# Patient Record
Sex: Male | Born: 1995 | Hispanic: Yes | Marital: Single | Smoking: Never smoker
Health system: Southern US, Community
[De-identification: ages and names within clinical notes are randomized; demographics above are authoritative.]

---

## 2015-02-11 ENCOUNTER — Emergency Department (HOSPITAL_COMMUNITY)
Admission: EM | Admit: 2015-02-11 | Discharge: 2015-02-11 | Disposition: A | Discharge status: Home / Self Care | Payer: Medicaid Other | Attending: Emergency Medicine | Admitting: Emergency Medicine

## 2015-02-11 ENCOUNTER — Encounter (HOSPITAL_COMMUNITY): Payer: Self-pay

## 2015-02-11 ENCOUNTER — Emergency Department (HOSPITAL_COMMUNITY): Payer: Medicaid Other

## 2015-02-11 DIAGNOSIS — M791 Myalgia: Secondary | ICD-10-CM | POA: Diagnosis not present

## 2015-02-11 DIAGNOSIS — R05 Cough: Secondary | ICD-10-CM | POA: Diagnosis not present

## 2015-02-11 DIAGNOSIS — R6889 Other general symptoms and signs: Secondary | ICD-10-CM

## 2015-02-11 DIAGNOSIS — R51 Headache: Secondary | ICD-10-CM | POA: Insufficient documentation

## 2015-02-11 DIAGNOSIS — R509 Fever, unspecified: Secondary | ICD-10-CM | POA: Insufficient documentation

## 2015-02-11 LAB — CBC
HCT: 40.6 % (ref 39.0–52.0)
Hemoglobin: 13.8 g/dL (ref 13.0–17.0)
MCH: 25.2 pg — AB (ref 26.0–34.0)
MCHC: 34 g/dL (ref 30.0–36.0)
MCV: 74.2 fL — AB (ref 78.0–100.0)
PLATELETS: 155 10*3/uL (ref 150–400)
RBC: 5.47 MIL/uL (ref 4.22–5.81)
RDW: 13.1 % (ref 11.5–15.5)
WBC: 5.4 10*3/uL (ref 4.0–10.5)

## 2015-02-11 LAB — BASIC METABOLIC PANEL
Anion gap: 9 (ref 5–15)
BUN: 10 mg/dL (ref 6–23)
CO2: 23 mmol/L (ref 19–32)
Calcium: 9.3 mg/dL (ref 8.4–10.5)
Chloride: 102 mmol/L (ref 96–112)
Creatinine, Ser: 0.91 mg/dL (ref 0.50–1.35)
GFR calc Af Amer: >90 mL/min (ref >90)
GFR calc non Af Amer: >90 mL/min (ref >90)
Glucose, Bld: 109 mg/dL — ABNORMAL HIGH (ref 70–99)
POTASSIUM: 3.8 mmol/L (ref 3.5–5.1)
SODIUM: 134 mmol/L — AB (ref 135–145)

## 2015-02-11 MED ORDER — ACETAMINOPHEN 325 MG PO TABS
650.0000 mg | ORAL_TABLET | Freq: Once | ORAL | Status: AC
  Administered 2015-02-11: 650 mg via ORAL
  Filled 2015-02-11: qty 2

## 2015-02-11 MED ORDER — SODIUM CHLORIDE 0.9 % IV BOLUS (SEPSIS)
1000.0000 mL | Freq: Once | INTRAVENOUS | Status: AC
  Administered 2015-02-11: 1000 mL via INTRAVENOUS

## 2015-02-11 MED ORDER — IBUPROFEN 600 MG PO TABS: 600.0000 mg | ORAL_TABLET | Freq: Four times a day (QID) | ORAL | Status: AC | PRN

## 2015-02-11 MED ORDER — OSELTAMIVIR PHOSPHATE 75 MG PO CAPS: 75.0000 mg | ORAL_CAPSULE | Freq: Two times a day (BID) | ORAL | Status: AC

## 2015-02-11 MED ORDER — ACETAMINOPHEN 325 MG PO TABS: 650.0000 mg | ORAL_TABLET | ORAL | Status: AC | PRN

## 2015-02-11 NOTE — ED Provider Notes (Signed)
CSN: [id number]     Arrival date & time 02/11/15  1117 History   First MD Initiated Contact with Patient 02/11/15 1332     Chief Complaint  Patient presents with  . Fever   (Consider location/radiation/quality/duration/timing/severity/associated sxs/prior Treatment) Patient is a 19 y.o. male presenting with fever. A language interpreter was used.  Fever Associated symptoms: chills, cough and headaches   Associated symptoms: no chest pain, no diarrhea, no dysuria, no myalgias, no nausea, no rash, no sore throat and no vomiting     Benjamin Morrison is a 19 yo male presenting with fever, cough and headache.  Through an interpreter he reports he first noticed his symptoms yesterday.  He reports feeling hot and cold followed by a gradual onset headache.  He also reports a cough with occasional clear sputum.  He also reports generalized aching. He denies any nausea, vomiting, diarrhea or abd pain.    History reviewed. No pertinent past medical history. History reviewed. No pertinent past surgical history. No family history on file. History  Substance Use Topics  . Smoking status: Never Smoker   . Smokeless tobacco: Not on file  . Alcohol Use: No    Review of Systems  Constitutional: Positive for fever and chills.  HENT: Negative for sore throat.   Eyes: Negative for visual disturbance.  Respiratory: Positive for cough. Negative for shortness of breath.   Cardiovascular: Negative for chest pain and leg swelling.  Gastrointestinal: Negative for nausea, vomiting and diarrhea.  Genitourinary: Negative for dysuria.  Musculoskeletal: Negative for myalgias.  Skin: Negative for rash.  Neurological: Positive for headaches. Negative for weakness and numbness.    Allergies  Review of patient's allergies indicates no known allergies.  Home Medications   Prior to Admission medications   Not on File   BP 91/59 mmHg  Pulse 100  Temp(Src) 100.1 F (37.8 C) (Oral)  Resp 16  SpO2  96% Physical Exam  Constitutional: He is oriented to person, place, and time. He appears well-developed and well-nourished. No distress.  HENT:  Head: Normocephalic and atraumatic.  Mouth/Throat: Oropharynx is clear and moist. Mucous membranes are dry. No oropharyngeal exudate.  Eyes: Conjunctivae are normal. Pupils are equal, round, and reactive to light.  Neck: Neck supple. No thyromegaly present.  Cardiovascular: Normal rate, regular rhythm and intact distal pulses.   Pulmonary/Chest: Effort normal. No respiratory distress. He has no decreased breath sounds. He has no wheezes. He has no rhonchi. He has no rales. He exhibits no tenderness.  Abdominal: Soft. He exhibits no distension and no mass. There is no tenderness. There is no rebound and no guarding.  Musculoskeletal: He exhibits no tenderness.  Lymphadenopathy:    He has no cervical adenopathy.  Neurological: He is alert and oriented to person, place, and time. He has normal strength. No cranial nerve deficit or sensory deficit. Coordination normal. GCS eye subscore is 4. GCS verbal subscore is 5. GCS motor subscore is 6.  Cranial nerves 2-12 intact  Skin: Skin is warm and dry. No rash noted. He is not diaphoretic.  Psychiatric: He has a normal mood and affect.  Nursing note reviewed.   ED Course  Procedures (including critical care time) Labs Review Labs Reviewed  CBC - Abnormal; Notable for the following:    MCV 74.2 (*)    MCH 25.2 (*)    All other components within normal limits  BASIC METABOLIC PANEL - Abnormal; Notable for the following:    Sodium 134 (*)  Glucose, Bld 109 (*)    All other components within normal limits    Imaging Review Dg Chest 2 View  02/11/2015   CLINICAL DATA:  Acute fever, chills, headache and low back pain  EXAM: CHEST  2 VIEW  COMPARISON:  None.  FINDINGS: Mild cardiomegaly with central vascular prominence. No focal pneumonia, collapse or consolidation. Negative for edema, effusion  pneumothorax. Trachea midline. No acute osseous finding.  IMPRESSION: Cardiomegaly.  No superimposed acute process.   Electronically Signed   By: Judie Petit.  Shick M.D.   On: 02/11/2015 15:32     EKG Interpretation None      MDM   Final diagnoses:  Flu-like symptoms   19 yo with cough, fever, and muscle aches x 2 days. NS bolus, and tylenol, CBC, BMP, CXR.  Labs reviewed, no significant abnormalities. CXR shows cardiomegaly but negative for pulmonary edema, effusion or infiltrate. Discussed case with Dr. Patria Mane.  Pt symptoms consistent with influenza. Discussed findings with pt and treatment with Tamiflu. He reports feeling better after fluids and tylenol and his blood pressure improved. Pt has no concerning symptoms related to cardiomegaly but discussed importance of primary care follow-up. Pt speaks a dialect of [place]ese that is not spoken by the translation service. A friend via telephone was used to convey this information.  Due to limited English, Care Mgmt was consulted to connect pt to community resources.    4:05 PM: At end of shift, hand-off report given to Junius Finner, PA-C. Plan includes care Mgmt meeting with pt to prvide resources and then discharge.    Filed Vitals:   02/11/15 1600 02/11/15 1630 02/11/15 1700 02/11/15 1706  BP: 96/64 100/61 114/58 114/58  Pulse: 77 86 59 64  Temp:    98.6 F (37 C)  TempSrc:    Oral  Resp:    18  SpO2: 99% 96% 97% 98%   Meds given in ED:  Medications  sodium chloride 0.9 % bolus 1,000 mL (0 mLs Intravenous Stopped 02/11/15 1601)  acetaminophen (TYLENOL) tablet 650 mg (650 mg Oral Given 02/11/15 1415)    Discharge Medication List as of 02/11/2015  4:01 PM    START taking these medications   Details  acetaminophen (TYLENOL) 325 MG tablet Take 2 tablets (650 mg total) by mouth every 4 (four) hours as needed., Starting 02/11/2015, Until Discontinued, Print    ibuprofen (ADVIL,MOTRIN) 600 MG tablet Take 1 tablet (600 mg total) by mouth every 6  (six) hours as needed., Starting 02/11/2015, Until Discontinued, Print    oseltamivir (TAMIFLU) 75 MG capsule Take 1 capsule (75 mg total) by mouth every 12 (twelve) hours., Starting 02/11/2015, Until Discontinued, Print           Harle Battiest, NP 02/11/15 1933  Azalia Bilis, MD 02/13/15 [phone]

## 2015-02-11 NOTE — Discharge Instructions (Signed)
Please follow directions provided. Use the resource guide to establish care with a primary care doctor to ensure you're getting better. Drink plenty of fluids by mouth to stay well-hydrated. Take the Tamiflu as directed. Your chest x-ray is negative for an infection but shows an enlarged heart. This is not responsible for your symptoms today but will need to be seen by a regular doctor to check on.  Don't hesitate to return for any new, worsening or concerning symptoms.    SEEK IMMEDIATE MEDICAL CARE IF:  You have trouble breathing, you become short of breath, or your skin or nails become bluish.  You have severe pain or stiffness in the neck.  You develop a sudden headache, or pain in the face or ear.  You have nausea or vomiting that you cannot control.

## 2015-02-11 NOTE — ED Notes (Signed)
Patient reports fever and headache that began today with chills.  Pt denies abdominal pain, NVD, Chest pain, or shortness of breath.  Pt denies cough or nasal congestion.  PT reports right hip pain has been "going on for a while".  Dr. Patria Maneampos updated.  Explained blood work, IV insertion fluid bolus, and tylenol administration to patient. Pt is alert and interactive in NAD.  Assessment performed with interpreter.

## 2015-02-11 NOTE — ED Notes (Signed)
Per interperter phone, pt here for fever, chills, lower back pain and feeling lightheaded. Has not been around anyone else that has been sick. These symptoms started this morning. No nausea or vomiting.

## 2015-02-11 NOTE — ED Notes (Signed)
Burna MortimerWanda with social work at bedside.

## 2015-02-11 NOTE — ED Notes (Addendum)
Pt's primary language is [place]ing[place].

## 2015-02-11 NOTE — Care Management (Addendum)
ED CM received consult for assistance with f/u establishing primary care. Reviewed patient's record patient is uninsured without a PCP. Informed patient of the [facility] and services rendered. Offered to schedule appointment for f/u to establish primary care. Agreeable to plan, appointment scheduled for Wed. 3/23 at 2:30pm at [facility]. Patient informed that he bring any prescription received in the ED, he eligible [facility]. Verbalized understanding, discussed dsipositon plan with [facility] No furrher ED CM needs identified

## 2015-02-12 ENCOUNTER — Ambulatory Visit: Payer: Medicaid Other | Attending: Family Medicine | Admitting: Family Medicine

## 2015-02-12 VITALS — BP 98/60 | HR 103 | Temp 102.0°F | Resp 16 | Ht 65.0 in | Wt 125.0 lb

## 2015-02-12 DIAGNOSIS — J1189 Influenza due to unidentified influenza virus with other manifestations: Secondary | ICD-10-CM

## 2015-02-12 DIAGNOSIS — J111 Influenza due to unidentified influenza virus with other respiratory manifestations: Secondary | ICD-10-CM | POA: Insufficient documentation

## 2015-02-12 NOTE — Progress Notes (Signed)
Patient ID: Benjamin Morrison, male   DOB: 11/12/1996, 19 y.o.   MRN: [id number]   Chief complaint:  Influenza  Subjective:  Patient was seen in ED yesterday and diagnosed with influenza and given RX for tamiflu. He presents today to establish care and pick up prescriptions from ED. Through interpreter. He understands we can do nothing more for flu. He does need to make an appointment for routine care. He has medicaid.  He continues to have fever, body aches, headaches and cough.Chest x-ray shows cardiomegaly for which he needs to f/u  ROS: See HPI  Objective:  He appears moderately ill consistent with influenza. He is warm and dry. His lungs are clear to auscultation.  Plan:  Fill prescription for tamiflu.   Make appointment for about a week with assigned PCP for routine care and f/u cardiomegaly.  Henrietta HooverLinda C. Bernhardt, FNP,BC

## 2015-02-12 NOTE — Patient Instructions (Signed)
Get prescriptions filled. Make an appointment for next week.Follow instructions from ED.

## 2015-02-12 NOTE — Progress Notes (Signed)
Patient here to establish care.  Seen in Ed yesterday and dx with influenza.  Patient feeling poorly still and needs to pick up prescriptions today and make an appointment with a PCP for follow up care.

## 2015-02-21 ENCOUNTER — Ambulatory Visit: Payer: Medicaid Other | Admitting: Internal Medicine

## 2015-04-07 ENCOUNTER — Ambulatory Visit: Payer: Medicaid Other | Admitting: Family Medicine

## 2015-07-15 ENCOUNTER — Emergency Department (HOSPITAL_COMMUNITY)
Admission: EM | Admit: 2015-07-15 | Discharge: 2015-07-15 | Disposition: A | Discharge status: Home / Self Care | Payer: Medicaid Other | Attending: Emergency Medicine | Admitting: Emergency Medicine

## 2015-07-15 ENCOUNTER — Encounter (HOSPITAL_COMMUNITY): Payer: Self-pay | Admitting: Emergency Medicine

## 2015-07-15 ENCOUNTER — Emergency Department (HOSPITAL_COMMUNITY): Payer: Medicaid Other

## 2015-07-15 DIAGNOSIS — W260XXA Contact with knife, initial encounter: Secondary | ICD-10-CM | POA: Insufficient documentation

## 2015-07-15 DIAGNOSIS — Y9289 Other specified places as the place of occurrence of the external cause: Secondary | ICD-10-CM | POA: Insufficient documentation

## 2015-07-15 DIAGNOSIS — Y998 Other external cause status: Secondary | ICD-10-CM | POA: Insufficient documentation

## 2015-07-15 DIAGNOSIS — IMO0002 Reserved for concepts with insufficient information to code with codable children: Secondary | ICD-10-CM

## 2015-07-15 DIAGNOSIS — Z23 Encounter for immunization: Secondary | ICD-10-CM | POA: Insufficient documentation

## 2015-07-15 DIAGNOSIS — S51812A Laceration without foreign body of left forearm, initial encounter: Secondary | ICD-10-CM | POA: Insufficient documentation

## 2015-07-15 DIAGNOSIS — Y9389 Activity, other specified: Secondary | ICD-10-CM | POA: Diagnosis not present

## 2015-07-15 MED ORDER — BACITRACIN ZINC 500 UNIT/GM EX OINT
1.0000 "application " | TOPICAL_OINTMENT | Freq: Two times a day (BID) | CUTANEOUS | Status: DC
  Administered 2015-07-15: 1 via TOPICAL
  Filled 2015-07-15: qty 0.9

## 2015-07-15 MED ORDER — TETANUS-DIPHTH-ACELL PERTUSSIS 5-2.5-18.5 LF-MCG/0.5 IM SUSP
0.5000 mL | Freq: Once | INTRAMUSCULAR | Status: AC
  Administered 2015-07-15: 0.5 mL via INTRAMUSCULAR
  Filled 2015-07-15: qty 0.5

## 2015-07-15 NOTE — ED Provider Notes (Signed)
CSN: [id number]     Arrival date & time 07/15/15  0134 History   First MD Initiated Contact with Patient 07/15/15 [phone]     Chief Complaint  Patient presents with  . Extremity Laceration   Level 5 Caveat: Language Barrier  (Consider location/radiation/quality/duration/timing/severity/associated sxs/prior Treatment) HPI Benjamin Morrison is a 19 y.o. male speaks Burmese and medical interpreter does not understand patient's dialect. Through minimal translation and broken English, patient reports that he cut his left forearm at approximately 12 o'clock a.m. this morning with a box opener. He did not wash out his injury or do anything to improve his symptoms. Bleeding controlled PTA. Unsure of last tetanus.  History reviewed. No pertinent past medical history. History reviewed. No pertinent past surgical history. No family history on file. Social History  Substance Use Topics  . Smoking status: Never Smoker   . Smokeless tobacco: None  . Alcohol Use: No    Review of Systems  Unable to perform ROS     Allergies  Review of patient's allergies indicates no known allergies.  Home Medications   Prior to Admission medications   Medication Sig Start Date End Date Taking? Authorizing Provider  acetaminophen (TYLENOL) 325 MG tablet Take 2 tablets (650 mg total) by mouth every 4 (four) hours as needed. 02/11/15   Harle Battiest, NP  ibuprofen (ADVIL,MOTRIN) 600 MG tablet Take 1 tablet (600 mg total) by mouth every 6 (six) hours as needed. 02/11/15   Harle Battiest, NP  oseltamivir (TAMIFLU) 75 MG capsule Take 1 capsule (75 mg total) by mouth every 12 (twelve) hours. 02/11/15   Harle Battiest, NP   BP 113/62 mmHg  Pulse 51  Temp(Src) 98.2 F (36.8 C) (Oral)  Resp 18  SpO2 99% Physical Exam  Constitutional:  Awake, alert, nontoxic appearance.  HENT:  Head: Atraumatic.  Eyes: Right eye exhibits no discharge. Left eye exhibits no discharge.  Neck: Neck supple.  Pulmonary/Chest:  Effort normal. He exhibits no tenderness.  Abdominal: Soft. There is no tenderness. There is no rebound.  Musculoskeletal: He exhibits no tenderness.  Baseline ROM, no obvious new focal weakness.  Neurological:  Mental status and motor strength appears baseline for patient and situation.  Skin: No rash noted.  Small, linear laceration noted to distal aspect of anterior left forearm  Psychiatric: He has a normal mood and affect.  Nursing note and vitals reviewed.   ED Course  Procedures (including critical care time) LACERATION REPAIR Performed by: Sharlene Motts Authorized by: Sharlene Motts Consent: Verbal consent obtained. Risks and benefits: risks, benefits and alternatives were discussed Consent given by: patient Patient identity confirmed: provided demographic data Prepped and Draped in normal sterile fashion Wound explored  Laceration Location: left forearm  Laceration Length: 2cm  No Foreign Bodies seen or palpated  Anesthesia: local infiltration  Local anesthetic: none  Anesthetic total: 0 ml  Irrigation method: syringe Amount of cleaning: standard  Skin closure: bacitracin  Number of sutures: bacitracin  Technique: bacitracin  Patient tolerance: Patient tolerated the procedure well with no immediate complications.  Labs Review Labs Reviewed - No data to display  Imaging Review Dg Forearm Left  07/15/2015   CLINICAL DATA:  Wrist laceration.  Initial encounter.  EXAM: LEFT FOREARM - 2 VIEW  COMPARISON:  None.  FINDINGS: No opaque foreign body or fracture.  IMPRESSION: Negative.   Electronically Signed   By: Marnee Spring M.D.   On: 07/15/2015 07:49   I have personally reviewed and evaluated these images  and lab results as part of my medical decision-making.   EKG Interpretation None     Meds given in ED:  Medications  bacitracin ointment 1 application (1 application Topical Given 07/15/15 0728)  Tdap (BOOSTRIX) injection 0.5 mL (0.5  mLs Intramuscular Given 07/15/15 0628)    New Prescriptions   No medications on file   Filed Vitals:   07/15/15 0530 07/15/15 0600 07/15/15 0606 07/15/15 0630  BP: 112/58 113/63 113/63 113/62  Pulse: 45 45 45 51  Temp:      TempSrc:      Resp:   18   SpO2: 100% 99% 100% 99%    MDM  Vitals stable - WNL -afebrile Pt resting comfortably in ED. PE--physical exam as above, patient maintains full range of motion, neurovascularly intact. Labwork--tetanus updated in ED today Imaging--plain films of left forearm are negative for foreign body. Laceration repair performed by myself at bedside, wound edges approximate independently. Covered with bacitracin and sterile gauze after copious irrigation. I discussed all relevant lab findings and imaging results with pt and they verbalized understanding. I personally reviewed the imaging and agree with the results as interpreted by the radiologist.  Discussed f/u with PCP within 48 hrs and return precautions, pt very amenable to plan.  Final diagnoses:  Laceration       Joycie Peek, PA-C 07/15/15 [id number]  Loren Racer, MD 07/16/15 0100

## 2015-07-15 NOTE — Discharge Instructions (Signed)
Please keep your wound clean and dry. If you begin to see  Signs of increased redness or swelling, fevers or chills nor need to return to ED for reevaluation. Otherwise, this wound will heal on its own.  Laceration Care, Adult A laceration is a cut or lesion that goes through all layers of the skin and into the tissue just beneath the skin. TREATMENT  Some lacerations may not require closure. Some lacerations may not be able to be closed due to an increased risk of infection. It is important to see your caregiver as soon as possible after an injury to minimize the risk of infection and maximize the opportunity for successful closure. If closure is appropriate, pain medicines may be given, if needed. The wound will be cleaned to help prevent infection. Your caregiver will use stitches (sutures), staples, wound glue (adhesive), or skin adhesive strips to repair the laceration. These tools bring the skin edges together to allow for faster healing and a better cosmetic outcome. However, all wounds will heal with a scar. Once the wound has healed, scarring can be minimized by covering the wound with sunscreen during the day for 1 full year. HOME CARE INSTRUCTIONS  For sutures or staples:  Keep the wound clean and dry.  If you were given a bandage (dressing), you should change it at least once a day. Also, change the dressing if it becomes wet or dirty, or as directed by your caregiver.  Wash the wound with soap and water 2 times a day. Rinse the wound off with water to remove all soap. Pat the wound dry with a clean towel.  After cleaning, apply a thin layer of the antibiotic ointment as recommended by your caregiver. This will help prevent infection and keep the dressing from sticking.  You may shower as usual after the first 24 hours. Do not soak the wound in water until the sutures are removed.  Only take over-the-counter or prescription medicines for pain, discomfort, or fever as directed by your  caregiver.  Get your sutures or staples removed as directed by your caregiver. For skin adhesive strips:  Keep the wound clean and dry.  Do not get the skin adhesive strips wet. You may bathe carefully, using caution to keep the wound dry.  If the wound gets wet, pat it dry with a clean towel.  Skin adhesive strips will fall off on their own. You may trim the strips as the wound heals. Do not remove skin adhesive strips that are still stuck to the wound. They will fall off in time. For wound adhesive:  You may briefly wet your wound in the shower or bath. Do not soak or scrub the wound. Do not swim. Avoid periods of heavy perspiration until the skin adhesive has fallen off on its own. After showering or bathing, gently pat the wound dry with a clean towel.  Do not apply liquid medicine, cream medicine, or ointment medicine to your wound while the skin adhesive is in place. This may loosen the film before your wound is healed.  If a dressing is placed over the wound, be careful not to apply tape directly over the skin adhesive. This may cause the adhesive to be pulled off before the wound is healed.  Avoid prolonged exposure to sunlight or tanning lamps while the skin adhesive is in place. Exposure to ultraviolet light in the first year will darken the scar.  The skin adhesive will usually remain in place for 5 to 10  days, then naturally fall off the skin. Do not pick at the adhesive film. You may need a tetanus shot if:  You cannot remember when you had your last tetanus shot.  You have never had a tetanus shot. If you get a tetanus shot, your arm may swell, get red, and feel warm to the touch. This is common and not a problem. If you need a tetanus shot and you choose not to have one, there is a rare chance of getting tetanus. Sickness from tetanus can be serious. SEEK MEDICAL CARE IF:   You have redness, swelling, or increasing pain in the wound.  You see a red line that goes away  from the wound.  You have yellowish-white fluid (pus) coming from the wound.  You have a fever.  You notice a bad smell coming from the wound or dressing.  Your wound breaks open before or after sutures have been removed.  You notice something coming out of the wound such as wood or glass.  Your wound is on your hand or foot and you cannot move a finger or toe. SEEK IMMEDIATE MEDICAL CARE IF:   Your pain is not controlled with prescribed medicine.  You have severe swelling around the wound causing pain and numbness or a change in color in your arm, hand, leg, or foot.  Your wound splits open and starts bleeding.  You have worsening numbness, weakness, or loss of function of any joint around or beyond the wound.  You develop painful lumps near the wound or on the skin anywhere on your body. MAKE SURE YOU:   Understand these instructions.  Will watch your condition.  Will get help right away if you are not doing well or get worse. Document Released: 11/08/2005 Document Revised: 01/31/2012 Document Reviewed: 05/04/2011 [facility] Patient Information 2015 [place], [place]. This information is not intended to replace advice given to you by your health care provider. Make sure you discuss any questions you have with your health care provider.

## 2015-07-15 NOTE — ED Notes (Signed)
Patient transported to X-ray 

## 2015-07-15 NOTE — ED Notes (Signed)
Suture cart bedside. 

## 2015-07-15 NOTE — ED Notes (Signed)
Pt reports  He was opening a box with a knife and the knife went into his L lower arm. Bleeding controlled.

## 2016-11-21 IMAGING — DX DG FOREARM 2V*L*
2 series · 2 of 2 positions shown · non-contrast
Comparison: None.

CLINICAL DATA: Wrist laceration.  Initial encounter.

EXAM:
LEFT FOREARM - 2 VIEW

[forearm ap]
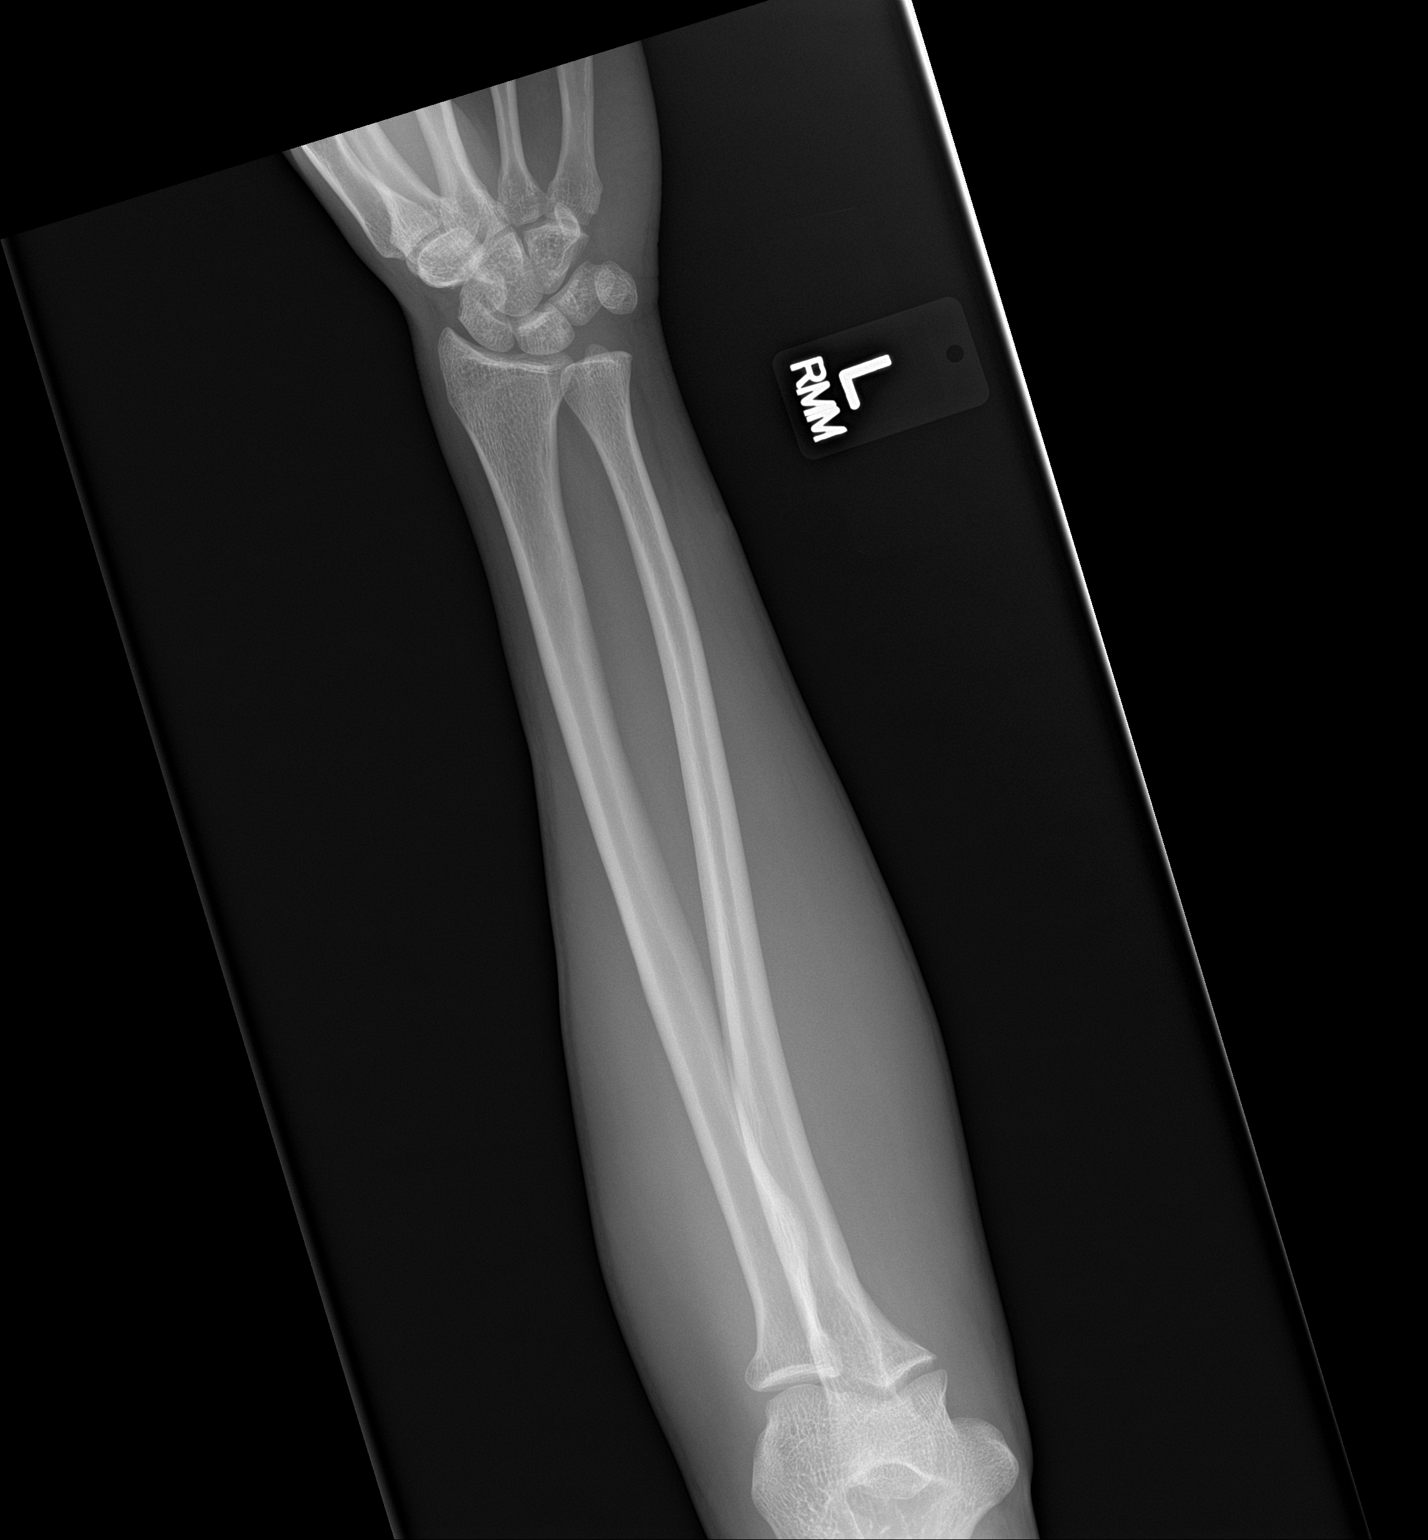

[forearm lat]
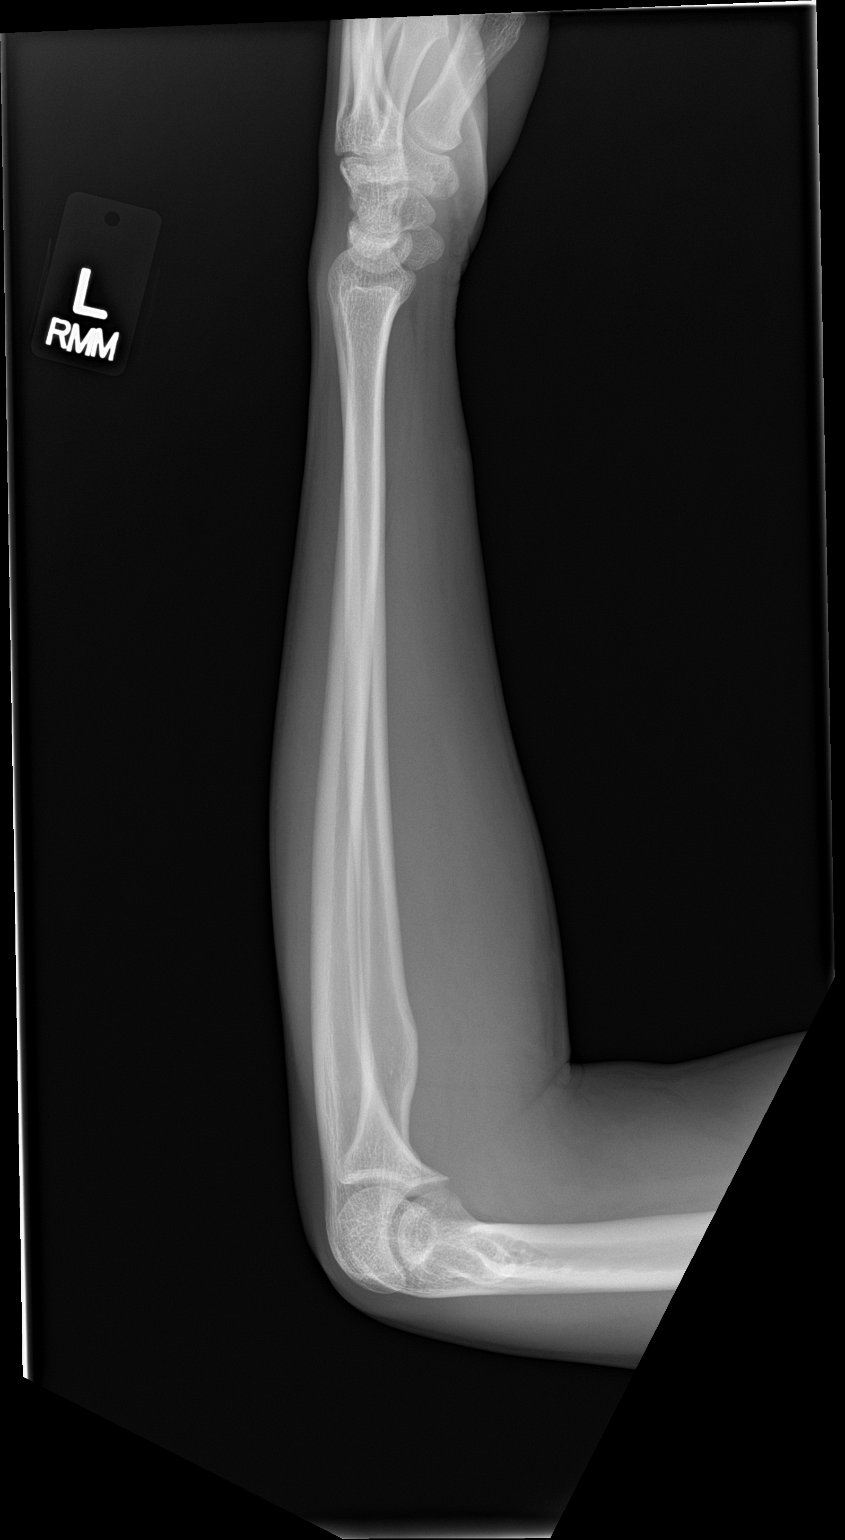

[2 of 2 positions shown; findings below may reference images not displayed]

FINDINGS: No opaque foreign body or fracture.
IMPRESSION: Negative.
